# Patient Record
Sex: Female | Born: 2000 | Race: Black or African American | Hispanic: Yes | Marital: Single | Smoking: Never smoker
Health system: Southern US, Community
[De-identification: ages and names within clinical notes are randomized; demographics above are authoritative.]

---
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
## 2022-11-13 ENCOUNTER — Ambulatory Visit: Payer: No Typology Code available for payment source | Admitting: Internal Medicine

## 2023-12-21 ENCOUNTER — Encounter: Payer: Self-pay | Admitting: Nurse Practitioner
